# Patient Record
Sex: Female | Born: 1950 | Race: White | Hispanic: No
Health system: Southern US, Community
[De-identification: ages and names within clinical notes are randomized; demographics above are authoritative.]

---

## 2000-12-16 ENCOUNTER — Encounter (INDEPENDENT_AMBULATORY_CARE_PROVIDER_SITE_OTHER): Payer: Self-pay | Admitting: *Deleted

## 2000-12-16 ENCOUNTER — Ambulatory Visit (HOSPITAL_BASED_OUTPATIENT_CLINIC_OR_DEPARTMENT_OTHER): Admission: RE | Admit: 2000-12-16 | Discharge: 2000-12-16 | Payer: Self-pay | Admitting: *Deleted

## 2001-10-02 ENCOUNTER — Encounter: Payer: Self-pay | Admitting: Internal Medicine

## 2001-10-02 ENCOUNTER — Encounter: Admission: RE | Admit: 2001-10-02 | Discharge: 2001-10-02 | Payer: Self-pay | Admitting: Internal Medicine

## 2007-09-08 ENCOUNTER — Ambulatory Visit (HOSPITAL_COMMUNITY): Admission: RE | Admit: 2007-09-08 | Discharge: 2007-09-08 | Payer: Self-pay | Admitting: *Deleted

## 2010-07-21 ENCOUNTER — Other Ambulatory Visit: Payer: Self-pay | Admitting: Internal Medicine

## 2010-07-21 ENCOUNTER — Other Ambulatory Visit (HOSPITAL_COMMUNITY)
Admission: RE | Admit: 2010-07-21 | Discharge: 2010-07-21 | Disposition: A | Discharge status: Home / Self Care | Payer: BC Managed Care – PPO | Source: Ambulatory Visit | Attending: Internal Medicine | Admitting: Internal Medicine

## 2010-07-21 DIAGNOSIS — Z1159 Encounter for screening for other viral diseases: Secondary | ICD-10-CM | POA: Insufficient documentation

## 2010-07-21 DIAGNOSIS — Z01419 Encounter for gynecological examination (general) (routine) without abnormal findings: Secondary | ICD-10-CM | POA: Insufficient documentation

## 2010-09-01 NOTE — Op Note (Signed)
NAMESETAREH, ROM                 ACCOUNT NO.:  [id number]   MEDICAL RECORD NO.:  [id number]          PATIENT TYPE:  AMB   LOCATION:  ENDO                         FACILITY:  [facility]   PHYSICIAN:  Georgiana Spinner, M.D.    DATE OF BIRTH:  1950-12-23   DATE OF PROCEDURE:  DATE OF DISCHARGE:                               OPERATIVE REPORT   PROCEDURE:  Colonoscopy.   INDICATIONS:  Polyp screening.   ANESTHESIA:  Fentanyl 85 mcg, Versed 7 mg.   DESCRIPTION OF PROCEDURE:  With the patient mildly sedated in the left  lateral decubitus position, the Pentax videoscopic colonoscope was  inserted in the rectum and passed under direct vision to the cecum,  identified by ileocecal valve and appendiceal orifice, both of which  were photographed.  From this point, the colonoscope was slowly  withdrawn, taking circumferential views of colonic mucosa, stopping only  in the rectum which appeared normal on direct, but showed one hemorrhoid  on retroflexed view.  The endoscope was straightened and withdrawn.  The  patient's vital signs and pulse oximeter remained stable.  The patient  tolerated the procedure well without apparent complications.   FINDINGS:  Internal hemorrhoid, otherwise an unremarkable examination.   PLAN:  Have the patient follow-up with me as needed in approximately 5  years.           ______________________________  Georgiana Spinner, M.D.     GMO/MEDQ  D:  09/08/2007  T:  09/08/2007  Job:  [id number]

## 2010-09-04 NOTE — Op Note (Signed)
[place]  Patient:    Jenna Dawson, Jenna Dawson Visit Number: [id number] MRN: [id number]          Service Type: DSU Location: [facility] Attending Physician:  Vikki Ports Dictated by:   Earna Coder, M.D. Proc. Date: 12/16/00 Admit Date:  12/16/2000 Discharge Date: 12/16/2000                             Operative Report  PREOPERATIVE DIAGNOSIS:  Right shoulder mass.  POSTOPERATIVE DIAGNOSIS:  Right shoulder mass.  OPERATION PERFORMED:  Excision of right shoulder mass.  SURGEON:  Stephenie Acres, M.D.  ANESTHESIA:  MAC.  DESCRIPTION OF PROCEDURE:  The patient was taken to the operating room and placed in supine position.  After adequate MAC anesthesia was induced, the right shoulder was prepped and draped in normal sterile fashion.  Using a transverse incision over the soft, palpable mass, I dissected down onto what appeared to be a well encapsulated lipoma which was excised in its entirety. The skin was closed with a subcuticular 4-0 Monocryl.  Steri-Strips and sterile dressing was applied.  The patient tolerated the procedure well and went to PACU in good condition. Dictated by:   Earna Coder, M.D. Attending Physician:  Danna Hefty R. DD:  01/11/01 TD:  01/11/01 Job: [id number]

## 2013-09-03 ENCOUNTER — Other Ambulatory Visit: Payer: Self-pay

## 2013-09-03 DIAGNOSIS — Z1231 Encounter for screening mammogram for malignant neoplasm of breast: Secondary | ICD-10-CM

## 2013-09-12 ENCOUNTER — Ambulatory Visit
Admission: RE | Admit: 2013-09-12 | Discharge: 2013-09-12 | Disposition: A | Discharge status: Home / Self Care | Payer: BC Managed Care – PPO | Source: Ambulatory Visit

## 2013-09-12 ENCOUNTER — Encounter (INDEPENDENT_AMBULATORY_CARE_PROVIDER_SITE_OTHER): Payer: Self-pay

## 2013-09-12 DIAGNOSIS — Z1231 Encounter for screening mammogram for malignant neoplasm of breast: Secondary | ICD-10-CM

## 2013-09-13 ENCOUNTER — Other Ambulatory Visit: Payer: Self-pay | Admitting: Internal Medicine

## 2013-09-13 DIAGNOSIS — R928 Other abnormal and inconclusive findings on diagnostic imaging of breast: Secondary | ICD-10-CM

## 2013-09-20 ENCOUNTER — Other Ambulatory Visit (HOSPITAL_COMMUNITY)
Admission: RE | Admit: 2013-09-20 | Discharge: 2013-09-20 | Disposition: A | Discharge status: Home / Self Care | Payer: BC Managed Care – PPO | Source: Ambulatory Visit | Attending: Internal Medicine | Admitting: Internal Medicine

## 2013-09-20 ENCOUNTER — Other Ambulatory Visit: Payer: Self-pay | Admitting: Internal Medicine

## 2013-09-20 DIAGNOSIS — Z01419 Encounter for gynecological examination (general) (routine) without abnormal findings: Secondary | ICD-10-CM | POA: Insufficient documentation

## 2013-09-20 DIAGNOSIS — Z1151 Encounter for screening for human papillomavirus (HPV): Secondary | ICD-10-CM | POA: Insufficient documentation

## 2013-09-25 ENCOUNTER — Ambulatory Visit
Admission: RE | Admit: 2013-09-25 | Discharge: 2013-09-25 | Disposition: A | Discharge status: Home / Self Care | Payer: BC Managed Care – PPO | Source: Ambulatory Visit | Attending: Internal Medicine | Admitting: Internal Medicine

## 2013-09-25 DIAGNOSIS — R928 Other abnormal and inconclusive findings on diagnostic imaging of breast: Secondary | ICD-10-CM

## 2013-09-25 LAB — CYTOLOGY - PAP

## 2015-10-07 ENCOUNTER — Other Ambulatory Visit: Payer: Self-pay | Admitting: Internal Medicine

## 2015-10-07 DIAGNOSIS — Z1231 Encounter for screening mammogram for malignant neoplasm of breast: Secondary | ICD-10-CM

## 2015-10-20 ENCOUNTER — Ambulatory Visit
Admission: RE | Admit: 2015-10-20 | Discharge: 2015-10-20 | Disposition: A | Discharge status: Home / Self Care | Payer: BC Managed Care – PPO | Source: Ambulatory Visit | Attending: Internal Medicine | Admitting: Internal Medicine

## 2015-10-20 DIAGNOSIS — Z1231 Encounter for screening mammogram for malignant neoplasm of breast: Secondary | ICD-10-CM

## 2016-09-14 ENCOUNTER — Other Ambulatory Visit: Payer: Self-pay | Admitting: Internal Medicine

## 2016-09-14 DIAGNOSIS — Z1231 Encounter for screening mammogram for malignant neoplasm of breast: Secondary | ICD-10-CM

## 2016-11-09 ENCOUNTER — Ambulatory Visit
Admission: RE | Admit: 2016-11-09 | Discharge: 2016-11-09 | Disposition: A | Discharge status: Home / Self Care | Payer: Medicare Other | Source: Ambulatory Visit | Attending: Internal Medicine | Admitting: Internal Medicine

## 2016-11-09 DIAGNOSIS — Z1231 Encounter for screening mammogram for malignant neoplasm of breast: Secondary | ICD-10-CM

## 2017-11-14 ENCOUNTER — Other Ambulatory Visit: Payer: Self-pay | Admitting: Internal Medicine

## 2017-11-14 DIAGNOSIS — Z1231 Encounter for screening mammogram for malignant neoplasm of breast: Secondary | ICD-10-CM

## 2017-12-14 ENCOUNTER — Ambulatory Visit
Admission: RE | Admit: 2017-12-14 | Discharge: 2017-12-14 | Disposition: A | Discharge status: Home / Self Care | Payer: Medicare Other | Source: Ambulatory Visit | Attending: Internal Medicine | Admitting: Internal Medicine

## 2017-12-14 DIAGNOSIS — Z1231 Encounter for screening mammogram for malignant neoplasm of breast: Secondary | ICD-10-CM

## 2018-12-18 ENCOUNTER — Other Ambulatory Visit: Payer: Self-pay | Admitting: Internal Medicine

## 2018-12-18 DIAGNOSIS — Z1231 Encounter for screening mammogram for malignant neoplasm of breast: Secondary | ICD-10-CM

## 2019-02-01 ENCOUNTER — Other Ambulatory Visit: Payer: Self-pay

## 2019-02-01 ENCOUNTER — Ambulatory Visit
Admission: RE | Admit: 2019-02-01 | Discharge: 2019-02-01 | Disposition: A | Discharge status: Home / Self Care | Payer: Medicare Other | Source: Ambulatory Visit | Attending: Internal Medicine | Admitting: Internal Medicine

## 2019-02-01 DIAGNOSIS — Z1231 Encounter for screening mammogram for malignant neoplasm of breast: Secondary | ICD-10-CM

## 2019-05-18 ENCOUNTER — Ambulatory Visit: Payer: Medicare Other

## 2019-09-07 DIAGNOSIS — H2513 Age-related nuclear cataract, bilateral: Secondary | ICD-10-CM | POA: Diagnosis not present

## 2019-09-07 DIAGNOSIS — H01115 Allergic dermatitis of left lower eyelid: Secondary | ICD-10-CM | POA: Diagnosis not present

## 2019-09-07 DIAGNOSIS — H16202 Unspecified keratoconjunctivitis, left eye: Secondary | ICD-10-CM | POA: Diagnosis not present

## 2019-11-06 DIAGNOSIS — H0288B Meibomian gland dysfunction left eye, upper and lower eyelids: Secondary | ICD-10-CM | POA: Diagnosis not present

## 2019-11-06 DIAGNOSIS — H25813 Combined forms of age-related cataract, bilateral: Secondary | ICD-10-CM | POA: Diagnosis not present

## 2019-11-06 DIAGNOSIS — H02831 Dermatochalasis of right upper eyelid: Secondary | ICD-10-CM | POA: Diagnosis not present

## 2019-11-06 DIAGNOSIS — H02834 Dermatochalasis of left upper eyelid: Secondary | ICD-10-CM | POA: Diagnosis not present

## 2019-11-06 DIAGNOSIS — H0288A Meibomian gland dysfunction right eye, upper and lower eyelids: Secondary | ICD-10-CM | POA: Diagnosis not present

## 2019-11-06 DIAGNOSIS — H04123 Dry eye syndrome of bilateral lacrimal glands: Secondary | ICD-10-CM | POA: Diagnosis not present

## 2020-01-16 DIAGNOSIS — M8580 Other specified disorders of bone density and structure, unspecified site: Secondary | ICD-10-CM | POA: Diagnosis not present

## 2020-01-16 DIAGNOSIS — I10 Essential (primary) hypertension: Secondary | ICD-10-CM | POA: Diagnosis not present

## 2020-01-16 DIAGNOSIS — E559 Vitamin D deficiency, unspecified: Secondary | ICD-10-CM | POA: Diagnosis not present

## 2020-01-21 ENCOUNTER — Other Ambulatory Visit: Payer: Self-pay | Admitting: Internal Medicine

## 2020-01-21 DIAGNOSIS — Z1231 Encounter for screening mammogram for malignant neoplasm of breast: Secondary | ICD-10-CM

## 2020-01-23 DIAGNOSIS — Z23 Encounter for immunization: Secondary | ICD-10-CM | POA: Diagnosis not present

## 2020-01-23 DIAGNOSIS — M8580 Other specified disorders of bone density and structure, unspecified site: Secondary | ICD-10-CM | POA: Diagnosis not present

## 2020-01-23 DIAGNOSIS — I10 Essential (primary) hypertension: Secondary | ICD-10-CM | POA: Diagnosis not present

## 2020-01-23 DIAGNOSIS — E7800 Pure hypercholesterolemia, unspecified: Secondary | ICD-10-CM | POA: Diagnosis not present

## 2020-01-23 DIAGNOSIS — Z0000 Encounter for general adult medical examination without abnormal findings: Secondary | ICD-10-CM | POA: Diagnosis not present

## 2020-02-12 ENCOUNTER — Ambulatory Visit: Payer: Medicare Other

## 2020-02-14 DIAGNOSIS — M8580 Other specified disorders of bone density and structure, unspecified site: Secondary | ICD-10-CM | POA: Diagnosis not present

## 2020-02-14 DIAGNOSIS — M8589 Other specified disorders of bone density and structure, multiple sites: Secondary | ICD-10-CM | POA: Diagnosis not present

## 2020-02-14 DIAGNOSIS — R030 Elevated blood-pressure reading, without diagnosis of hypertension: Secondary | ICD-10-CM | POA: Diagnosis not present

## 2020-02-14 DIAGNOSIS — Z01419 Encounter for gynecological examination (general) (routine) without abnormal findings: Secondary | ICD-10-CM | POA: Diagnosis not present

## 2020-02-14 DIAGNOSIS — Z1212 Encounter for screening for malignant neoplasm of rectum: Secondary | ICD-10-CM | POA: Diagnosis not present

## 2020-03-25 ENCOUNTER — Ambulatory Visit: Payer: Self-pay

## 2020-05-06 ENCOUNTER — Ambulatory Visit: Payer: Self-pay

## 2020-05-15 ENCOUNTER — Other Ambulatory Visit: Payer: Self-pay

## 2020-05-15 ENCOUNTER — Ambulatory Visit
Admission: RE | Admit: 2020-05-15 | Discharge: 2020-05-15 | Disposition: A | Discharge status: Home / Self Care | Payer: Medicare PPO | Source: Ambulatory Visit | Attending: Internal Medicine | Admitting: Internal Medicine

## 2020-05-15 DIAGNOSIS — Z1231 Encounter for screening mammogram for malignant neoplasm of breast: Secondary | ICD-10-CM | POA: Diagnosis not present

## 2021-01-19 DIAGNOSIS — H0288A Meibomian gland dysfunction right eye, upper and lower eyelids: Secondary | ICD-10-CM | POA: Diagnosis not present

## 2021-01-19 DIAGNOSIS — H25813 Combined forms of age-related cataract, bilateral: Secondary | ICD-10-CM | POA: Diagnosis not present

## 2021-01-19 DIAGNOSIS — H04123 Dry eye syndrome of bilateral lacrimal glands: Secondary | ICD-10-CM | POA: Diagnosis not present

## 2021-01-19 DIAGNOSIS — H02834 Dermatochalasis of left upper eyelid: Secondary | ICD-10-CM | POA: Diagnosis not present

## 2021-01-19 DIAGNOSIS — H0288B Meibomian gland dysfunction left eye, upper and lower eyelids: Secondary | ICD-10-CM | POA: Diagnosis not present

## 2021-01-19 DIAGNOSIS — H11001 Unspecified pterygium of right eye: Secondary | ICD-10-CM | POA: Diagnosis not present

## 2021-01-19 DIAGNOSIS — H02831 Dermatochalasis of right upper eyelid: Secondary | ICD-10-CM | POA: Diagnosis not present

## 2021-01-26 DIAGNOSIS — E7801 Familial hypercholesterolemia: Secondary | ICD-10-CM | POA: Diagnosis not present

## 2021-01-26 DIAGNOSIS — I10 Essential (primary) hypertension: Secondary | ICD-10-CM | POA: Diagnosis not present

## 2021-01-29 ENCOUNTER — Other Ambulatory Visit: Payer: Self-pay | Admitting: Internal Medicine

## 2021-01-29 DIAGNOSIS — R030 Elevated blood-pressure reading, without diagnosis of hypertension: Secondary | ICD-10-CM | POA: Diagnosis not present

## 2021-01-29 DIAGNOSIS — E7800 Pure hypercholesterolemia, unspecified: Secondary | ICD-10-CM | POA: Diagnosis not present

## 2021-01-29 DIAGNOSIS — D72819 Decreased white blood cell count, unspecified: Secondary | ICD-10-CM | POA: Diagnosis not present

## 2021-01-29 DIAGNOSIS — M8580 Other specified disorders of bone density and structure, unspecified site: Secondary | ICD-10-CM | POA: Diagnosis not present

## 2021-01-29 DIAGNOSIS — Z23 Encounter for immunization: Secondary | ICD-10-CM | POA: Diagnosis not present

## 2021-01-29 DIAGNOSIS — Z0000 Encounter for general adult medical examination without abnormal findings: Secondary | ICD-10-CM | POA: Diagnosis not present

## 2021-02-24 ENCOUNTER — Ambulatory Visit
Admission: RE | Admit: 2021-02-24 | Discharge: 2021-02-24 | Disposition: A | Discharge status: Home / Self Care | Payer: Self-pay | Source: Ambulatory Visit | Attending: Internal Medicine | Admitting: Internal Medicine

## 2021-02-24 DIAGNOSIS — E7800 Pure hypercholesterolemia, unspecified: Secondary | ICD-10-CM

## 2021-03-04 DIAGNOSIS — I2584 Coronary atherosclerosis due to calcified coronary lesion: Secondary | ICD-10-CM | POA: Diagnosis not present

## 2021-03-04 DIAGNOSIS — I2510 Atherosclerotic heart disease of native coronary artery without angina pectoris: Secondary | ICD-10-CM | POA: Diagnosis not present

## 2021-03-18 DIAGNOSIS — Z1212 Encounter for screening for malignant neoplasm of rectum: Secondary | ICD-10-CM | POA: Diagnosis not present

## 2021-03-18 DIAGNOSIS — Z01419 Encounter for gynecological examination (general) (routine) without abnormal findings: Secondary | ICD-10-CM | POA: Diagnosis not present

## 2021-03-30 DIAGNOSIS — I2510 Atherosclerotic heart disease of native coronary artery without angina pectoris: Secondary | ICD-10-CM | POA: Diagnosis not present

## 2021-03-30 DIAGNOSIS — I2584 Coronary atherosclerosis due to calcified coronary lesion: Secondary | ICD-10-CM | POA: Diagnosis not present

## 2021-03-31 DIAGNOSIS — I2510 Atherosclerotic heart disease of native coronary artery without angina pectoris: Secondary | ICD-10-CM | POA: Diagnosis not present

## 2021-05-19 ENCOUNTER — Other Ambulatory Visit: Payer: Self-pay | Admitting: Internal Medicine

## 2021-05-19 DIAGNOSIS — Z1231 Encounter for screening mammogram for malignant neoplasm of breast: Secondary | ICD-10-CM

## 2021-05-27 ENCOUNTER — Ambulatory Visit: Payer: No Typology Code available for payment source

## 2021-06-03 DIAGNOSIS — E7801 Familial hypercholesterolemia: Secondary | ICD-10-CM | POA: Diagnosis not present

## 2021-06-08 ENCOUNTER — Ambulatory Visit
Admission: RE | Admit: 2021-06-08 | Discharge: 2021-06-08 | Disposition: A | Discharge status: Home / Self Care | Payer: Medicare PPO | Source: Ambulatory Visit | Attending: Internal Medicine | Admitting: Internal Medicine

## 2021-06-08 DIAGNOSIS — Z1231 Encounter for screening mammogram for malignant neoplasm of breast: Secondary | ICD-10-CM | POA: Diagnosis not present

## 2022-01-11 DIAGNOSIS — M7521 Bicipital tendinitis, right shoulder: Secondary | ICD-10-CM | POA: Diagnosis not present

## 2022-01-19 DIAGNOSIS — R293 Abnormal posture: Secondary | ICD-10-CM | POA: Diagnosis not present

## 2022-01-19 DIAGNOSIS — M25611 Stiffness of right shoulder, not elsewhere classified: Secondary | ICD-10-CM | POA: Diagnosis not present

## 2022-01-19 DIAGNOSIS — M5412 Radiculopathy, cervical region: Secondary | ICD-10-CM | POA: Diagnosis not present

## 2022-01-19 DIAGNOSIS — M7521 Bicipital tendinitis, right shoulder: Secondary | ICD-10-CM | POA: Diagnosis not present

## 2022-01-19 DIAGNOSIS — M531 Cervicobrachial syndrome: Secondary | ICD-10-CM | POA: Diagnosis not present

## 2022-01-20 DIAGNOSIS — H11001 Unspecified pterygium of right eye: Secondary | ICD-10-CM | POA: Diagnosis not present

## 2022-01-20 DIAGNOSIS — H0288A Meibomian gland dysfunction right eye, upper and lower eyelids: Secondary | ICD-10-CM | POA: Diagnosis not present

## 2022-01-20 DIAGNOSIS — H02834 Dermatochalasis of left upper eyelid: Secondary | ICD-10-CM | POA: Diagnosis not present

## 2022-01-20 DIAGNOSIS — H25813 Combined forms of age-related cataract, bilateral: Secondary | ICD-10-CM | POA: Diagnosis not present

## 2022-01-20 DIAGNOSIS — H04123 Dry eye syndrome of bilateral lacrimal glands: Secondary | ICD-10-CM | POA: Diagnosis not present

## 2022-01-20 DIAGNOSIS — H0288B Meibomian gland dysfunction left eye, upper and lower eyelids: Secondary | ICD-10-CM | POA: Diagnosis not present

## 2022-01-20 DIAGNOSIS — H02831 Dermatochalasis of right upper eyelid: Secondary | ICD-10-CM | POA: Diagnosis not present

## 2022-01-22 DIAGNOSIS — M5412 Radiculopathy, cervical region: Secondary | ICD-10-CM | POA: Diagnosis not present

## 2022-01-22 DIAGNOSIS — R293 Abnormal posture: Secondary | ICD-10-CM | POA: Diagnosis not present

## 2022-01-22 DIAGNOSIS — M7521 Bicipital tendinitis, right shoulder: Secondary | ICD-10-CM | POA: Diagnosis not present

## 2022-01-22 DIAGNOSIS — M25611 Stiffness of right shoulder, not elsewhere classified: Secondary | ICD-10-CM | POA: Diagnosis not present

## 2022-01-22 DIAGNOSIS — M531 Cervicobrachial syndrome: Secondary | ICD-10-CM | POA: Diagnosis not present

## 2022-01-26 DIAGNOSIS — M531 Cervicobrachial syndrome: Secondary | ICD-10-CM | POA: Diagnosis not present

## 2022-01-26 DIAGNOSIS — M7521 Bicipital tendinitis, right shoulder: Secondary | ICD-10-CM | POA: Diagnosis not present

## 2022-01-26 DIAGNOSIS — M5412 Radiculopathy, cervical region: Secondary | ICD-10-CM | POA: Diagnosis not present

## 2022-01-26 DIAGNOSIS — M25611 Stiffness of right shoulder, not elsewhere classified: Secondary | ICD-10-CM | POA: Diagnosis not present

## 2022-01-26 DIAGNOSIS — R293 Abnormal posture: Secondary | ICD-10-CM | POA: Diagnosis not present

## 2022-02-08 DIAGNOSIS — I10 Essential (primary) hypertension: Secondary | ICD-10-CM | POA: Diagnosis not present

## 2022-02-18 DIAGNOSIS — I2510 Atherosclerotic heart disease of native coronary artery without angina pectoris: Secondary | ICD-10-CM | POA: Diagnosis not present

## 2022-02-18 DIAGNOSIS — Z0000 Encounter for general adult medical examination without abnormal findings: Secondary | ICD-10-CM | POA: Diagnosis not present

## 2022-02-18 DIAGNOSIS — M8580 Other specified disorders of bone density and structure, unspecified site: Secondary | ICD-10-CM | POA: Diagnosis not present

## 2022-02-18 DIAGNOSIS — E7800 Pure hypercholesterolemia, unspecified: Secondary | ICD-10-CM | POA: Diagnosis not present

## 2022-02-18 DIAGNOSIS — R030 Elevated blood-pressure reading, without diagnosis of hypertension: Secondary | ICD-10-CM | POA: Diagnosis not present

## 2022-02-18 DIAGNOSIS — Z23 Encounter for immunization: Secondary | ICD-10-CM | POA: Diagnosis not present

## 2022-02-18 DIAGNOSIS — M8589 Other specified disorders of bone density and structure, multiple sites: Secondary | ICD-10-CM | POA: Diagnosis not present

## 2022-02-24 DIAGNOSIS — R030 Elevated blood-pressure reading, without diagnosis of hypertension: Secondary | ICD-10-CM | POA: Diagnosis not present

## 2022-03-05 DIAGNOSIS — Z1212 Encounter for screening for malignant neoplasm of rectum: Secondary | ICD-10-CM | POA: Diagnosis not present

## 2022-03-05 DIAGNOSIS — Z1211 Encounter for screening for malignant neoplasm of colon: Secondary | ICD-10-CM | POA: Diagnosis not present

## 2022-04-02 DIAGNOSIS — D225 Melanocytic nevi of trunk: Secondary | ICD-10-CM | POA: Diagnosis not present

## 2022-04-02 DIAGNOSIS — Z1283 Encounter for screening for malignant neoplasm of skin: Secondary | ICD-10-CM | POA: Diagnosis not present

## 2022-05-10 ENCOUNTER — Other Ambulatory Visit: Payer: Self-pay | Admitting: Internal Medicine

## 2022-05-10 DIAGNOSIS — Z1231 Encounter for screening mammogram for malignant neoplasm of breast: Secondary | ICD-10-CM

## 2022-06-30 ENCOUNTER — Ambulatory Visit
Admission: RE | Admit: 2022-06-30 | Discharge: 2022-06-30 | Disposition: A | Discharge status: Home / Self Care | Payer: Medicare PPO | Source: Ambulatory Visit | Attending: Internal Medicine | Admitting: Internal Medicine

## 2022-06-30 DIAGNOSIS — Z1231 Encounter for screening mammogram for malignant neoplasm of breast: Secondary | ICD-10-CM

## 2023-01-26 DIAGNOSIS — H0288A Meibomian gland dysfunction right eye, upper and lower eyelids: Secondary | ICD-10-CM | POA: Diagnosis not present

## 2023-01-26 DIAGNOSIS — H02834 Dermatochalasis of left upper eyelid: Secondary | ICD-10-CM | POA: Diagnosis not present

## 2023-01-26 DIAGNOSIS — H02831 Dermatochalasis of right upper eyelid: Secondary | ICD-10-CM | POA: Diagnosis not present

## 2023-01-26 DIAGNOSIS — H04123 Dry eye syndrome of bilateral lacrimal glands: Secondary | ICD-10-CM | POA: Diagnosis not present

## 2023-01-26 DIAGNOSIS — H11001 Unspecified pterygium of right eye: Secondary | ICD-10-CM | POA: Diagnosis not present

## 2023-01-26 DIAGNOSIS — H25813 Combined forms of age-related cataract, bilateral: Secondary | ICD-10-CM | POA: Diagnosis not present

## 2023-01-26 DIAGNOSIS — H0288B Meibomian gland dysfunction left eye, upper and lower eyelids: Secondary | ICD-10-CM | POA: Diagnosis not present

## 2023-02-17 DIAGNOSIS — I10 Essential (primary) hypertension: Secondary | ICD-10-CM | POA: Diagnosis not present

## 2023-02-22 DIAGNOSIS — R030 Elevated blood-pressure reading, without diagnosis of hypertension: Secondary | ICD-10-CM | POA: Diagnosis not present

## 2023-02-22 DIAGNOSIS — I2510 Atherosclerotic heart disease of native coronary artery without angina pectoris: Secondary | ICD-10-CM | POA: Diagnosis not present

## 2023-02-22 DIAGNOSIS — E559 Vitamin D deficiency, unspecified: Secondary | ICD-10-CM | POA: Diagnosis not present

## 2023-02-22 DIAGNOSIS — Z0000 Encounter for general adult medical examination without abnormal findings: Secondary | ICD-10-CM | POA: Diagnosis not present

## 2023-02-22 DIAGNOSIS — E7800 Pure hypercholesterolemia, unspecified: Secondary | ICD-10-CM | POA: Diagnosis not present

## 2023-02-22 DIAGNOSIS — Z23 Encounter for immunization: Secondary | ICD-10-CM | POA: Diagnosis not present

## 2023-03-01 DIAGNOSIS — I10 Essential (primary) hypertension: Secondary | ICD-10-CM | POA: Diagnosis not present

## 2023-03-07 DIAGNOSIS — I10 Essential (primary) hypertension: Secondary | ICD-10-CM | POA: Diagnosis not present

## 2023-03-30 DIAGNOSIS — I10 Essential (primary) hypertension: Secondary | ICD-10-CM | POA: Diagnosis not present

## 2023-06-01 ENCOUNTER — Other Ambulatory Visit: Payer: Self-pay | Admitting: Internal Medicine

## 2023-06-01 DIAGNOSIS — Z1231 Encounter for screening mammogram for malignant neoplasm of breast: Secondary | ICD-10-CM

## 2023-06-12 IMAGING — MG MM DIGITAL SCREENING BILAT W/ TOMO AND CAD
6 of 12 series · 6 of 36 positions shown · non-contrast
Comparison: Previous exam(s).

CLINICAL DATA: Screening.

EXAM:
DIGITAL SCREENING BILATERAL MAMMOGRAM WITH TOMOSYNTHESIS AND CAD
TECHNIQUE: Bilateral screening digital craniocaudal and mediolateral oblique
mammograms were obtained. Bilateral screening digital breast
tomosynthesis was performed. The images were evaluated with
computer-aided detection.

[L CC synth-2D (1 of 2)]
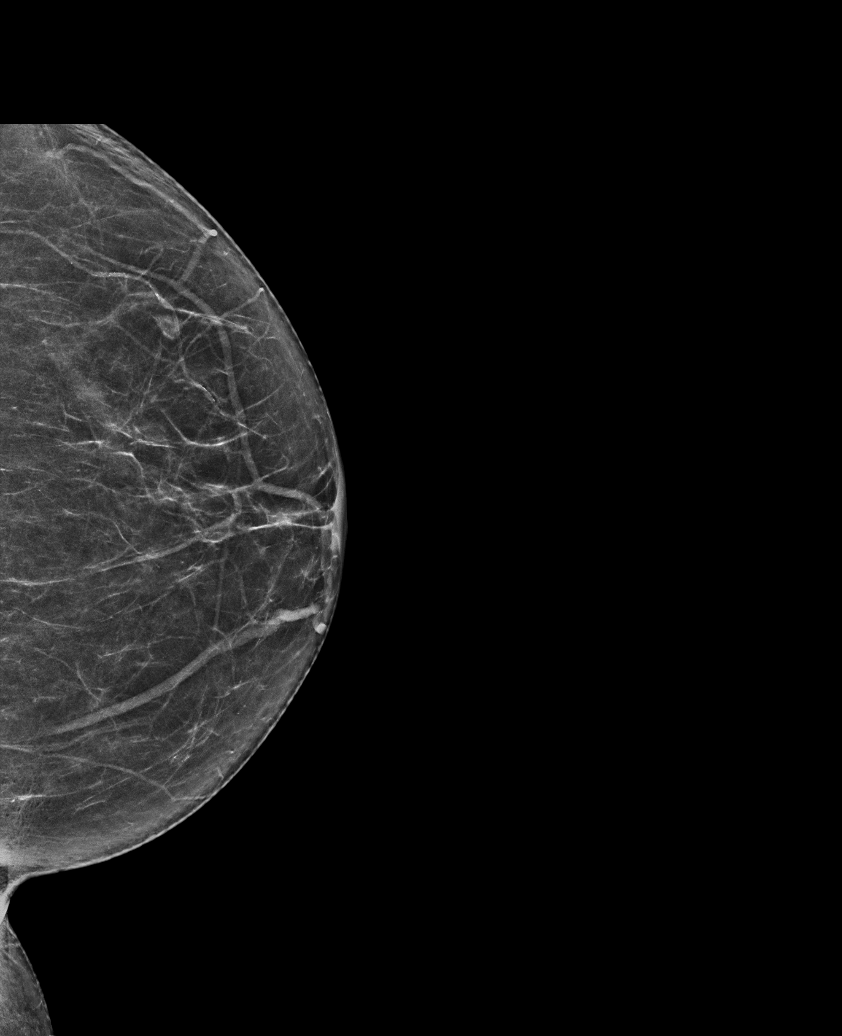

[R CC synth-2D (1 of 2)]
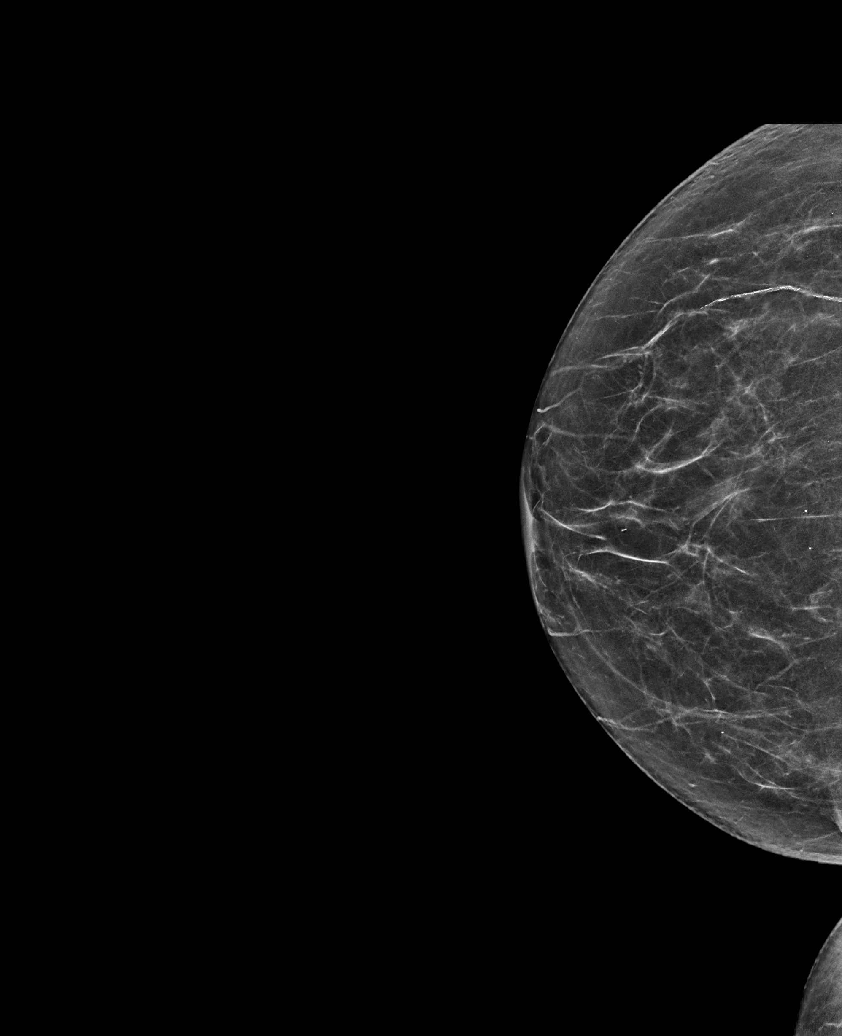

[L MLO synth-2D]
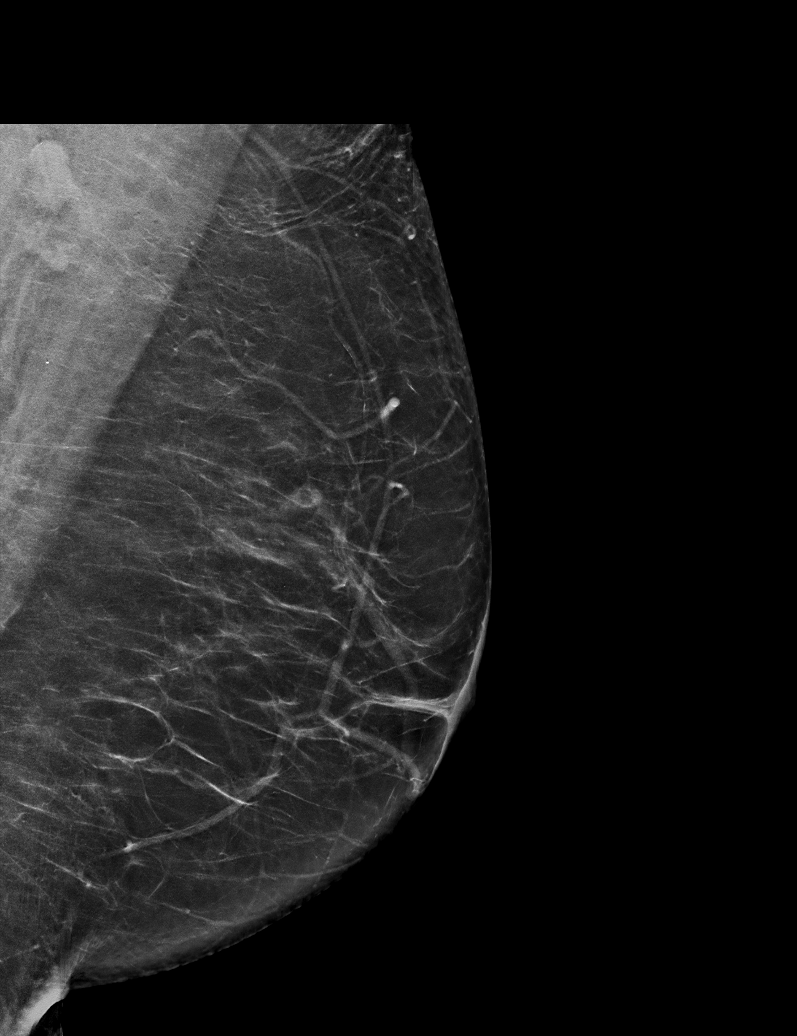

[R CC synth-2D (2 of 2)]
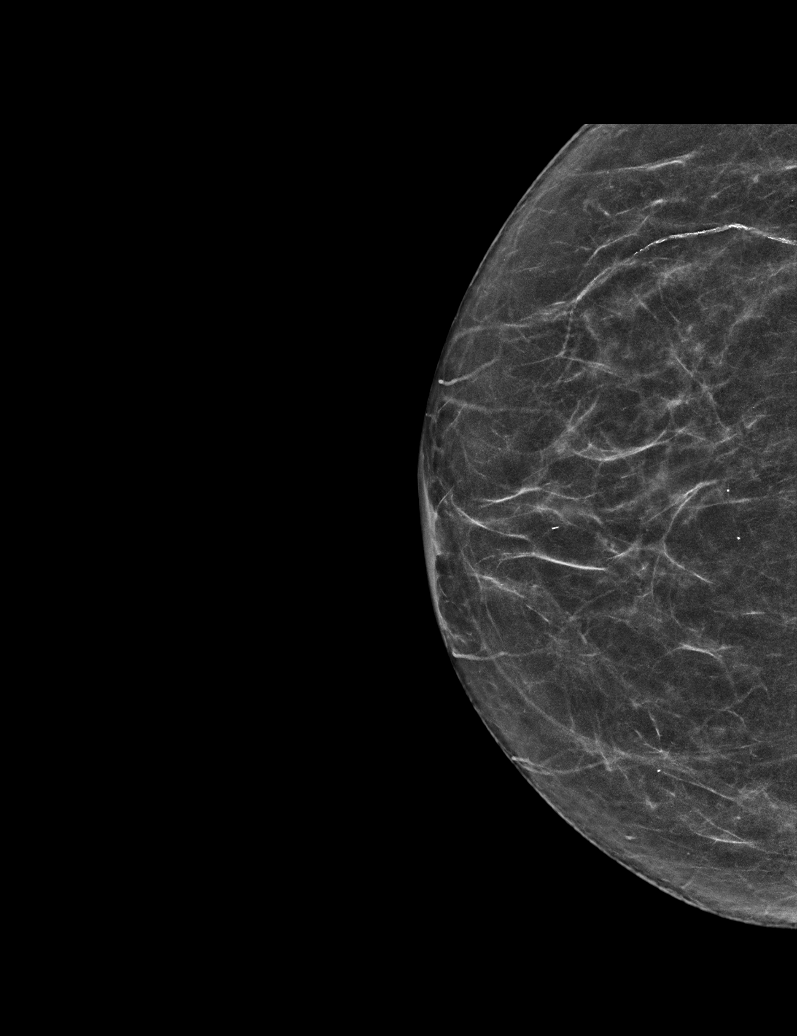

[L CC synth-2D (2 of 2)]
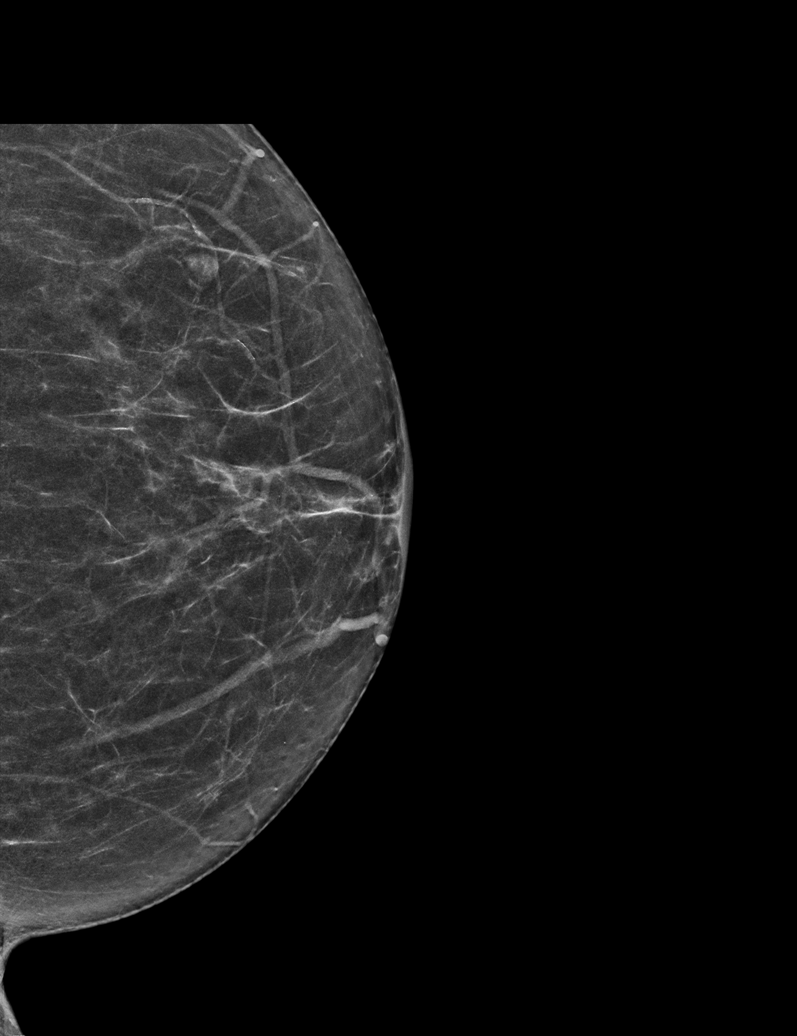

[R MLO synth-2D]
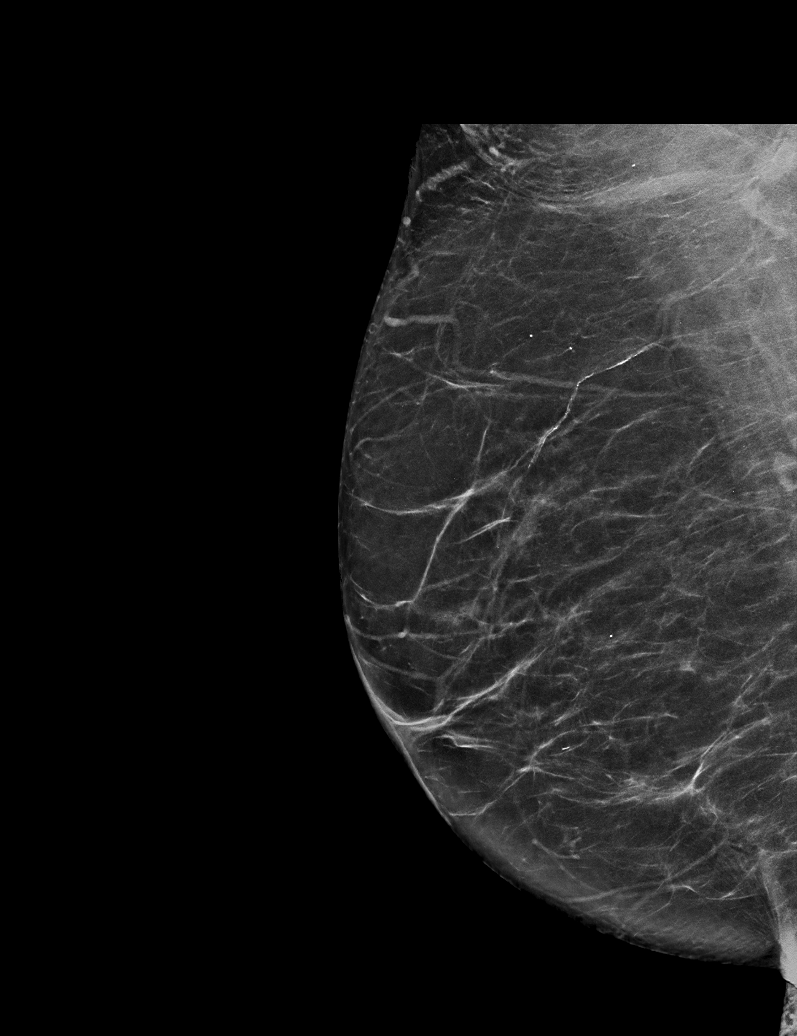

[6 of 36 positions shown; findings below may reference images not displayed]

ACR Breast Density Category b: There are scattered areas of
fibroglandular density.
FINDINGS: There are no findings suspicious for malignancy.
IMPRESSION: No mammographic evidence of malignancy. A result letter of this
screening mammogram will be mailed directly to the patient.

RECOMMENDATION:
Screening mammogram in one year. (Code:51-O-LD2)

BI-RADS CATEGORY  1: Negative.

## 2023-07-05 ENCOUNTER — Ambulatory Visit
Admission: RE | Admit: 2023-07-05 | Discharge: 2023-07-05 | Disposition: A | Discharge status: Home / Self Care | Payer: Medicare PPO | Source: Ambulatory Visit | Attending: Internal Medicine | Admitting: Internal Medicine

## 2023-07-05 DIAGNOSIS — Z1231 Encounter for screening mammogram for malignant neoplasm of breast: Secondary | ICD-10-CM

## 2024-02-01 DIAGNOSIS — H04123 Dry eye syndrome of bilateral lacrimal glands: Secondary | ICD-10-CM | POA: Diagnosis not present

## 2024-02-01 DIAGNOSIS — H02831 Dermatochalasis of right upper eyelid: Secondary | ICD-10-CM | POA: Diagnosis not present

## 2024-02-01 DIAGNOSIS — H02834 Dermatochalasis of left upper eyelid: Secondary | ICD-10-CM | POA: Diagnosis not present

## 2024-02-01 DIAGNOSIS — H25813 Combined forms of age-related cataract, bilateral: Secondary | ICD-10-CM | POA: Diagnosis not present

## 2024-02-20 DIAGNOSIS — I10 Essential (primary) hypertension: Secondary | ICD-10-CM | POA: Diagnosis not present

## 2024-02-20 DIAGNOSIS — E7800 Pure hypercholesterolemia, unspecified: Secondary | ICD-10-CM | POA: Diagnosis not present

## 2024-02-24 DIAGNOSIS — Z0000 Encounter for general adult medical examination without abnormal findings: Secondary | ICD-10-CM | POA: Diagnosis not present

## 2024-02-24 DIAGNOSIS — E559 Vitamin D deficiency, unspecified: Secondary | ICD-10-CM | POA: Diagnosis not present

## 2024-02-24 DIAGNOSIS — M8580 Other specified disorders of bone density and structure, unspecified site: Secondary | ICD-10-CM | POA: Diagnosis not present

## 2024-02-24 DIAGNOSIS — Z23 Encounter for immunization: Secondary | ICD-10-CM | POA: Diagnosis not present

## 2024-02-24 DIAGNOSIS — Z90711 Acquired absence of uterus with remaining cervical stump: Secondary | ICD-10-CM | POA: Diagnosis not present

## 2024-02-24 DIAGNOSIS — I2510 Atherosclerotic heart disease of native coronary artery without angina pectoris: Secondary | ICD-10-CM | POA: Diagnosis not present

## 2024-02-24 DIAGNOSIS — H11009 Unspecified pterygium of unspecified eye: Secondary | ICD-10-CM | POA: Diagnosis not present

## 2024-02-24 DIAGNOSIS — Z825 Family history of asthma and other chronic lower respiratory diseases: Secondary | ICD-10-CM | POA: Diagnosis not present

## 2024-02-24 DIAGNOSIS — I10 Essential (primary) hypertension: Secondary | ICD-10-CM | POA: Diagnosis not present
